# Patient Record
Sex: Male | Born: 1965 | Race: White | Hispanic: No | Marital: Single | Smoking: Former smoker
Health system: Southern US, Community
[De-identification: ages and names within clinical notes are randomized; demographics above are authoritative.]

## PROBLEM LIST (undated history)

## (undated) HISTORY — PX: HERNIA REPAIR: SHX51

---

## 2013-09-24 ENCOUNTER — Emergency Department: Payer: Self-pay | Admitting: Emergency Medicine

## 2015-06-01 IMAGING — CR CERVICAL SPINE - 2-3 VIEW
1 series · 4 of 4 positions shown · non-contrast
Comparison: None.

CLINICAL DATA: Neck pain for 6 weeks extending into the left
shoulder. Decreased range of motion.

EXAM:
CERVICAL SPINE - 2-3 VIEW

[Series 1: w cervical spine ap · 0.14mm/px · 4 of 4 slices shown]
[im 1/4]
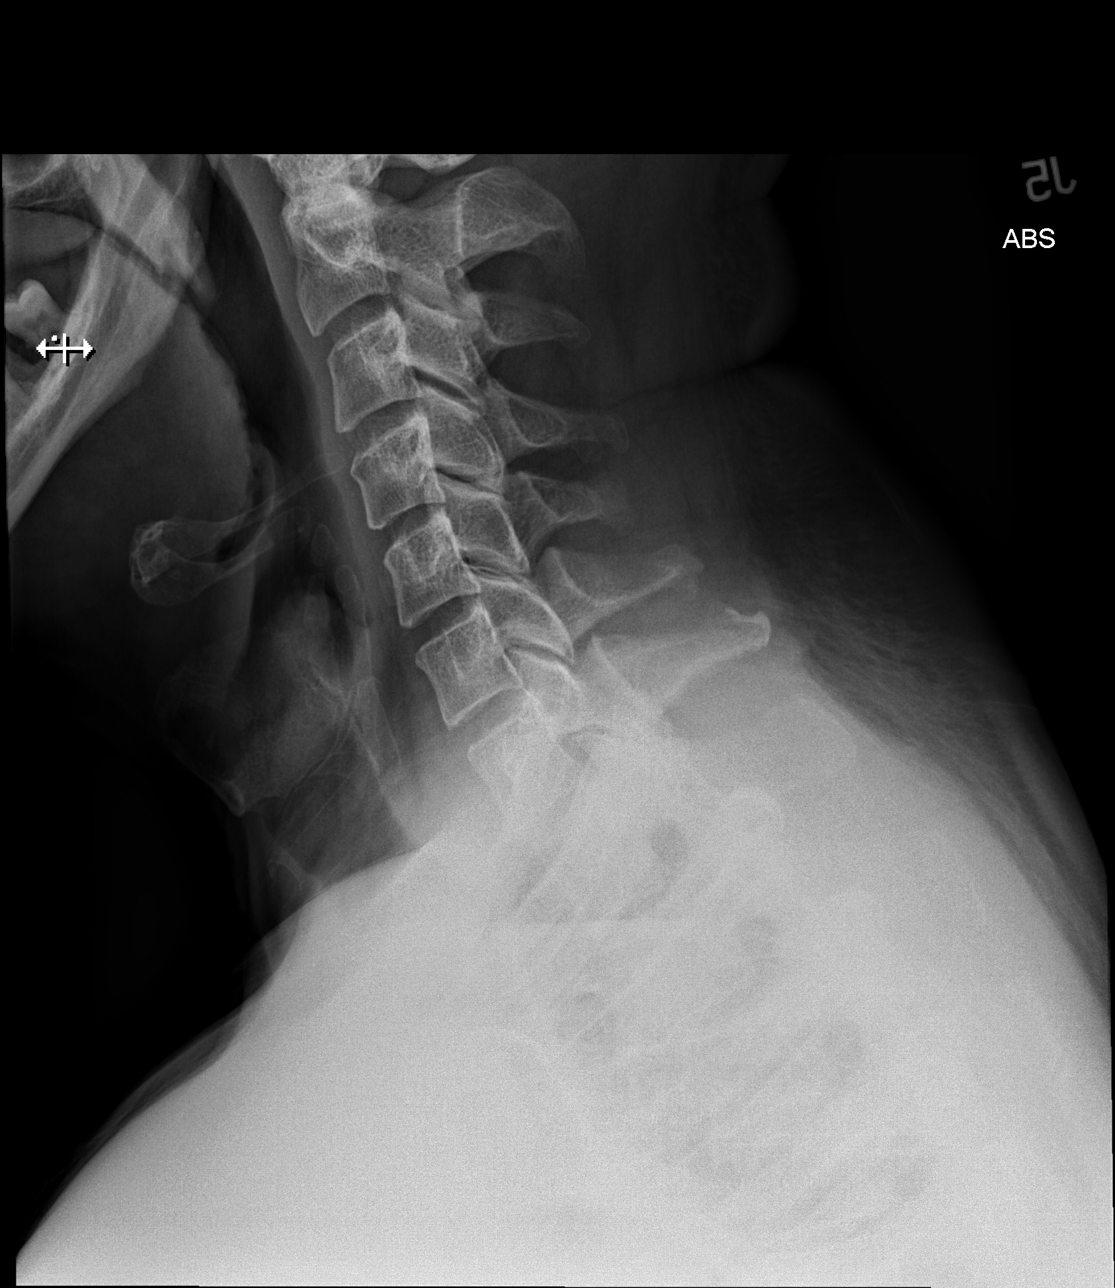
[im 2/4]
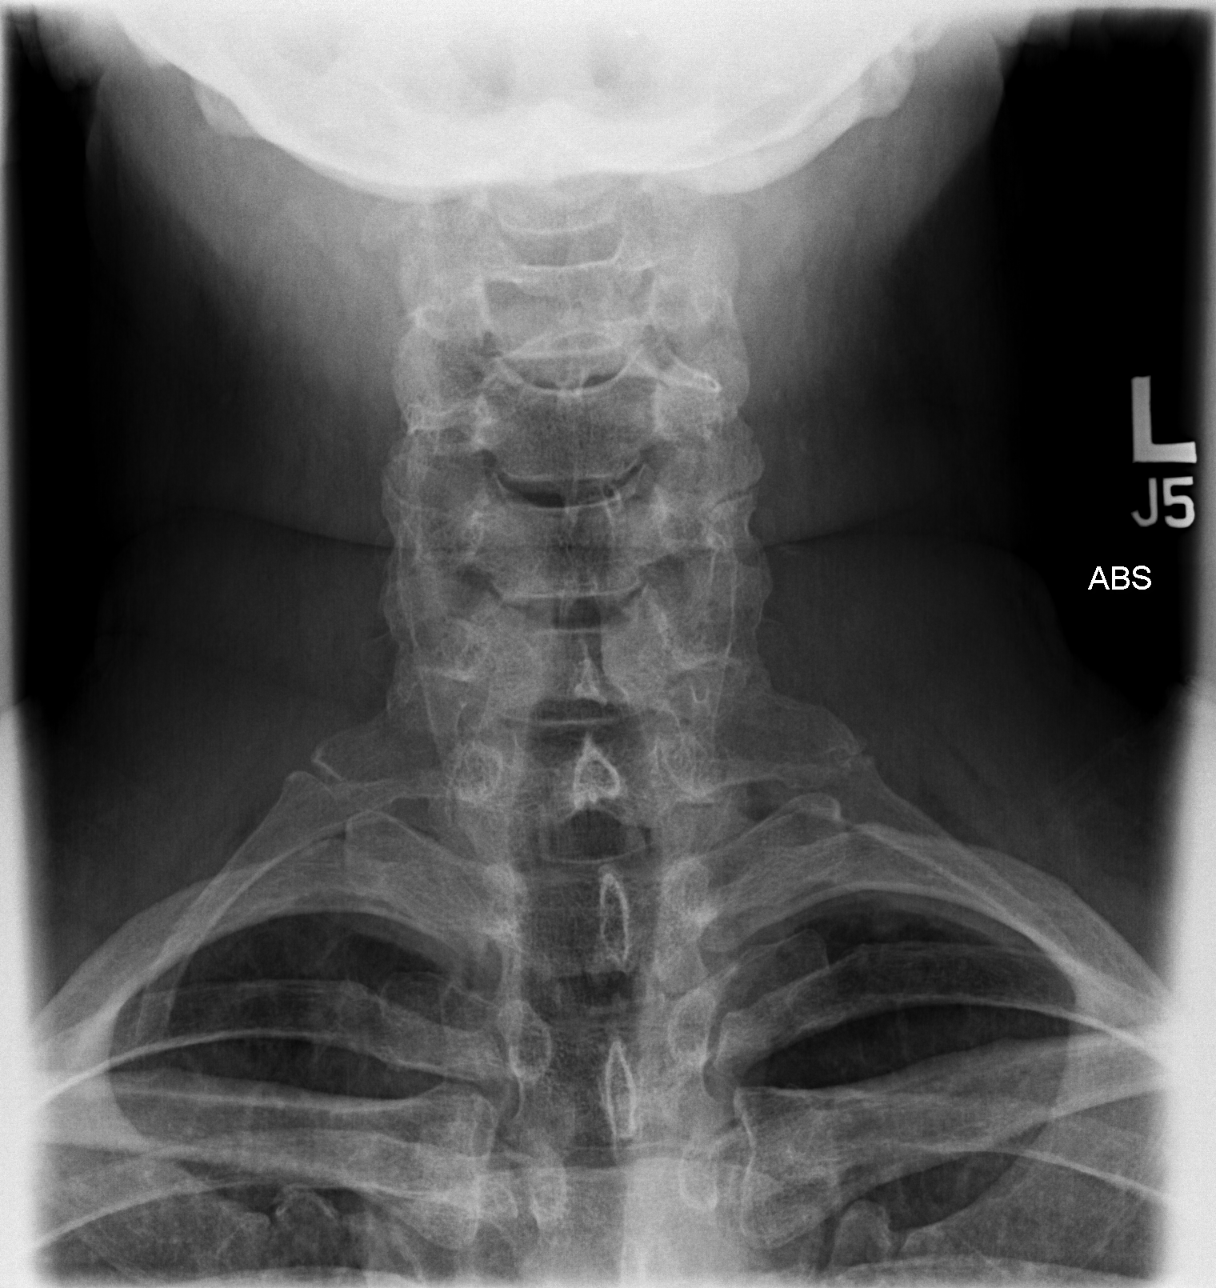
[im 3/4]
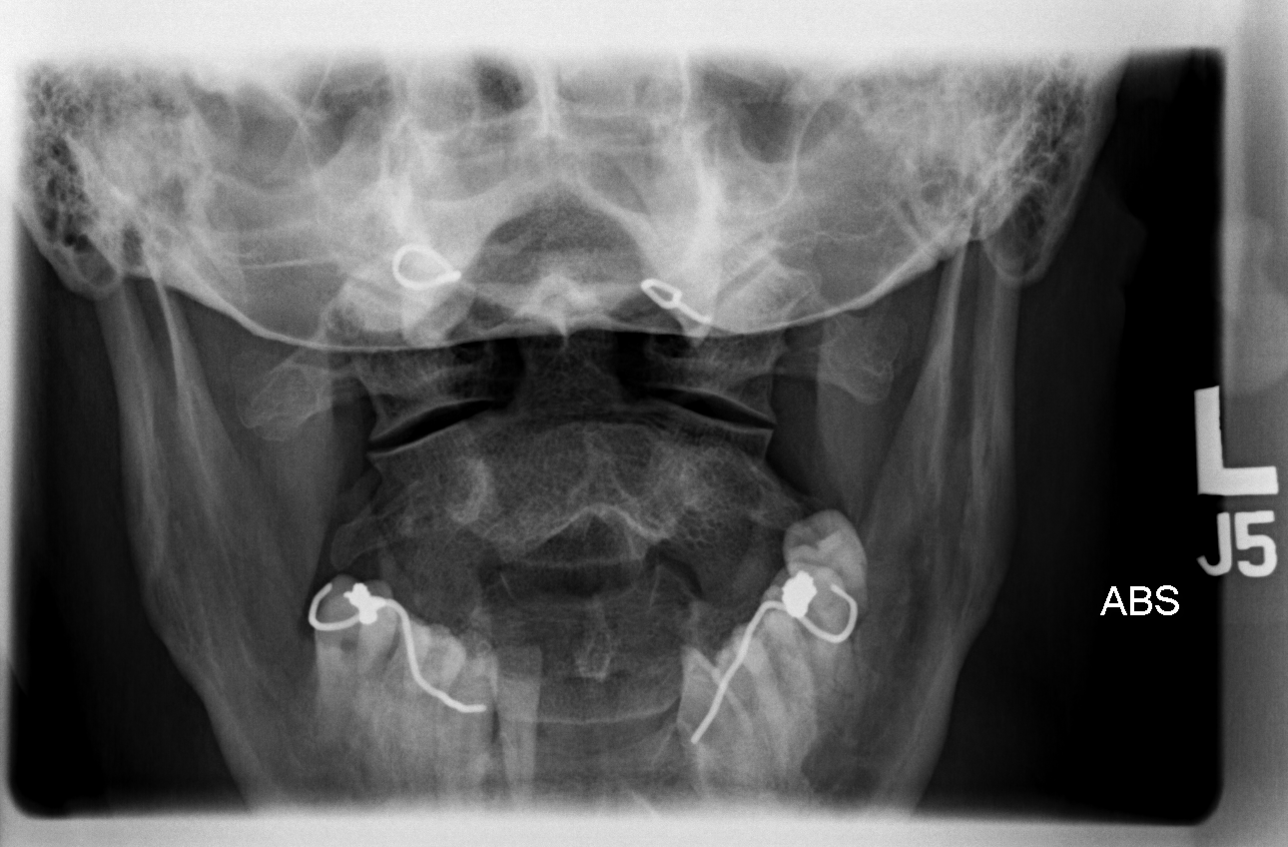
[im 4/4]
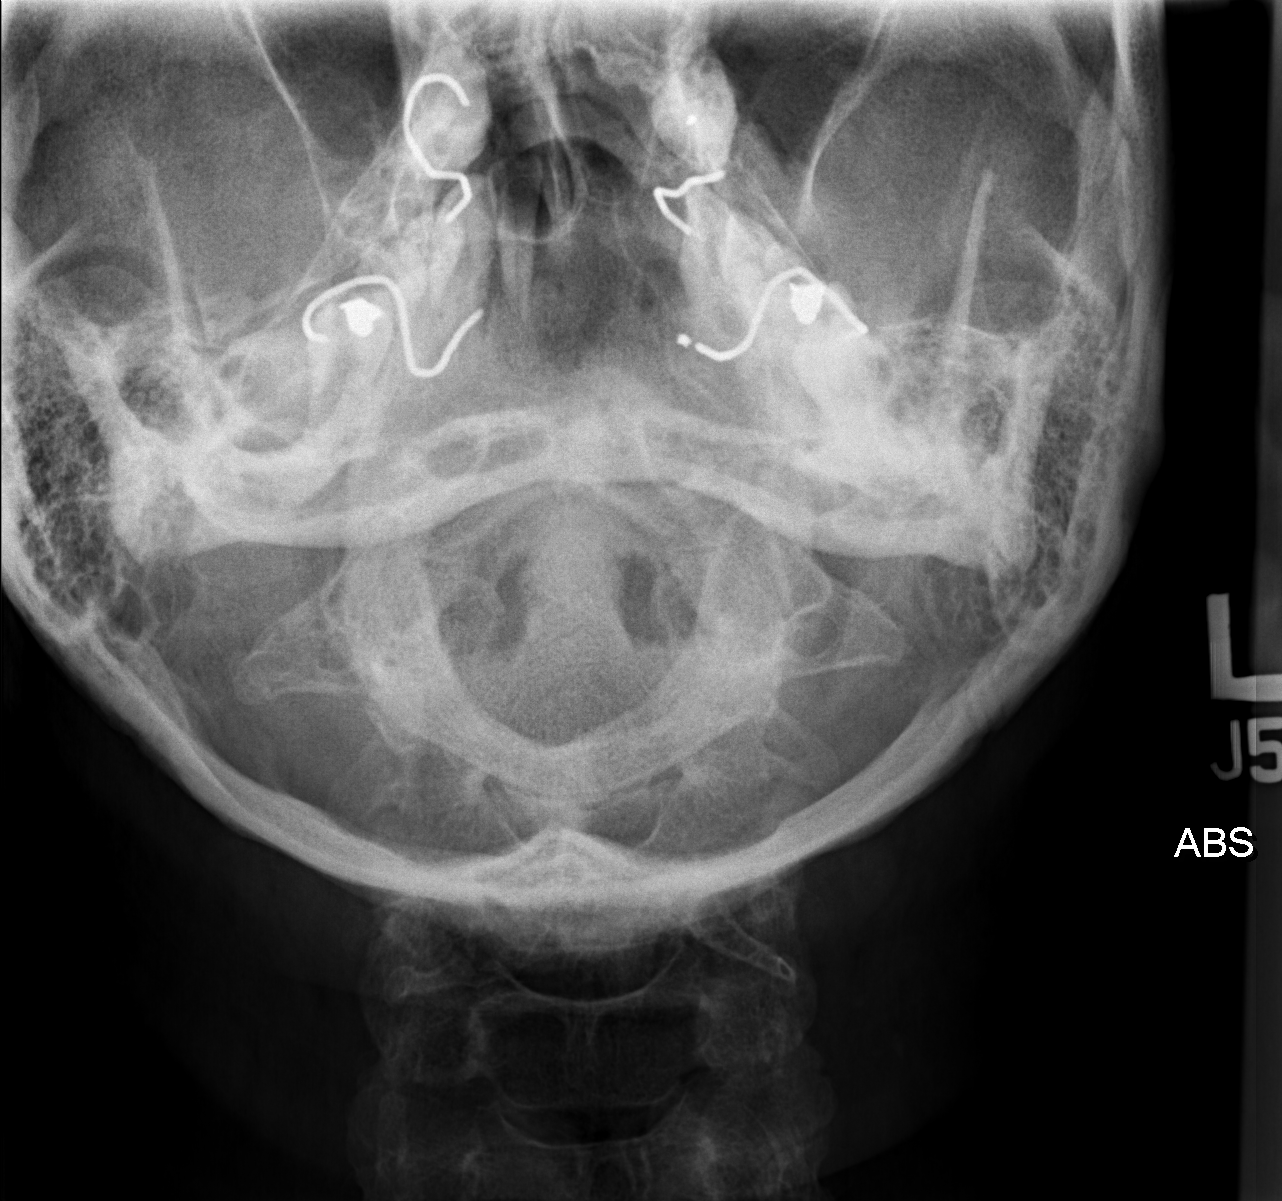

[4 of 4 positions shown; findings below may reference images not displayed]

FINDINGS: Vertebral alignment is normal. Prevertebral soft tissues are within
normal limits. Vertebral body heights are preserved. There is no
evidence of acute fracture. Intervertebral disc space heights are
preserved without significant degenerative changes identified.
IMPRESSION: No evidence of acute osseous abnormality or significant degenerative
change.

## 2016-05-08 ENCOUNTER — Emergency Department: Payer: Self-pay

## 2016-05-08 ENCOUNTER — Encounter: Payer: Self-pay | Admitting: Emergency Medicine

## 2016-05-08 ENCOUNTER — Emergency Department
Admission: EM | Admit: 2016-05-08 | Discharge: 2016-05-08 | Disposition: A | Discharge status: Home / Self Care | Payer: Self-pay | Attending: Emergency Medicine | Admitting: Emergency Medicine

## 2016-05-08 DIAGNOSIS — M62838 Other muscle spasm: Secondary | ICD-10-CM | POA: Insufficient documentation

## 2016-05-08 DIAGNOSIS — Z87891 Personal history of nicotine dependence: Secondary | ICD-10-CM | POA: Insufficient documentation

## 2016-05-08 DIAGNOSIS — M5412 Radiculopathy, cervical region: Secondary | ICD-10-CM | POA: Insufficient documentation

## 2016-05-08 MED ORDER — MELOXICAM 15 MG PO TABS: 15.0000 mg | ORAL_TABLET | Freq: Every day | ORAL | 0 refills | Status: AC

## 2016-05-08 MED ORDER — METHOCARBAMOL 500 MG PO TABS: 500.0000 mg | ORAL_TABLET | Freq: Four times a day (QID) | ORAL | 0 refills | Status: AC

## 2016-05-08 MED ORDER — KETOROLAC TROMETHAMINE 30 MG/ML IJ SOLN
30.0000 mg | Freq: Once | INTRAMUSCULAR | Status: AC
  Administered 2016-05-08: 30 mg via INTRAMUSCULAR
  Filled 2016-05-08: qty 1

## 2016-05-08 MED ORDER — ORPHENADRINE CITRATE 30 MG/ML IJ SOLN
60.0000 mg | Freq: Once | INTRAMUSCULAR | Status: AC
  Administered 2016-05-08: 60 mg via INTRAMUSCULAR
  Filled 2016-05-08: qty 2

## 2016-05-08 NOTE — ED Provider Notes (Signed)
[facility] Emergency Department Provider Note  ____________________________________________  Time seen: Approximately 6:26 PM  I have reviewed the triage vital signs and the nursing notes.   HISTORY  Chief Complaint Neck Pain and Back Pain    HPI Jeffrey Kerr is a 50 y.o. male who presents emergency department complaining of left-sided neck pain with radicular symptoms down left arm. Symptoms have been ongoing 3 weeks. Patient states that he was sitting when he looked over to the left and felt a pop in his neck. Patient began to have some left-sided neck pain with mild radicular symptoms after this event. Patient reports that 2-3 days ago he was helping move some furniture when the pain has become worsened. Abdomen or extremities the neck. No previous history of same. Patient does endorse some numbness and tingling in the digits of the left hand.No medications prior to arrival. No other complaints. No headaches, visual changes, chest pain, shortness of breath, abdominal pain, nausea or vomiting.   History reviewed. No pertinent past medical history.  There are no active problems to display for this patient.   Past Surgical History:  Procedure Laterality Date  . HERNIA REPAIR      Prior to Admission medications   Medication Sig Start Date End Date Taking? Authorizing Provider  meloxicam (MOBIC) 15 MG tablet Take 1 tablet (15 mg total) by mouth daily. 05/08/16   Delorise RoyalsJonathan D Cuthriell, PA-C  methocarbamol (ROBAXIN) 500 MG tablet Take 1 tablet (500 mg total) by mouth 4 (four) times daily. 05/08/16   Delorise RoyalsJonathan D Cuthriell, PA-C    Allergies Review of patient's allergies indicates no known allergies.  History reviewed. No pertinent family history.  Social History Social History  Substance Use Topics  . Smoking status: Former Games developermoker  . Smokeless tobacco: Never Used  . Alcohol use 0.6 oz/week    1 Cans of beer per week     Review of Systems   Constitutional: No fever/chills Eyes: No visual changes.  Cardiovascular: no chest pain. Respiratory: no cough. No SOB. Gastrointestinal: No abdominal pain.  No nausea, no vomiting.   Musculoskeletal: Positive for left-sided neck pain with radicular symptoms. Skin: Negative for rash, abrasions, lacerations, ecchymosis. Neurological: Negative for headaches, focal weakness or numbness. 10-point ROS otherwise negative.  ____________________________________________   PHYSICAL EXAM:  VITAL SIGNS: ED Triage Vitals  Enc Vitals Group     BP 05/08/16 1608 (!) 165/97     Pulse Rate 05/08/16 1608 75     Resp 05/08/16 1608 18     Temp 05/08/16 1608 97.9 F (36.6 C)     Temp Source 05/08/16 1608 Oral     SpO2 05/08/16 1608 (!) 75 %     Weight 05/08/16 1609 167 lb (75.8 kg)     Height 05/08/16 1609 5\' 7"  (1.702 m)     Head Circumference --      Peak Flow --      Pain Score 05/08/16 1609 9     Pain Loc --      Pain Edu? --      Excl. in GC? --      Constitutional: Alert and oriented. Well appearing and in no acute distress. Eyes: Conjunctivae are normal. PERRL. EOMI. Head: Atraumatic. Neck: No stridor.  No midline cervical spine tenderness to palpation. Patient is diffusely tender to palpation over the musculature left side of the neck. No specific point tenderness. No palpable abnormality. Neck is supple with full range of motion. Cap refill intact 5  digits both upper extremities. Sensation intact 5 digits both upper extremities.  Cardiovascular: Normal rate, regular rhythm. Normal S1 and S2.  Good peripheral circulation. Respiratory: Normal respiratory effort without tachypnea or retractions. Lungs CTAB. Good air entry to the bases with no decreased or absent breath sounds. Musculoskeletal: Full range of motion to all extremities. No gross deformities appreciated. Neurologic:  Normal speech and language. No gross focal neurologic deficits are appreciated.  Skin:  Skin is warm, dry  and intact. No rash noted. Psychiatric: Mood and affect are normal. Speech and behavior are normal. Patient exhibits appropriate insight and judgement.   ____________________________________________   LABS (all labs ordered are listed, but only abnormal results are displayed)  Labs Reviewed - No data to display ____________________________________________  EKG   ____________________________________________  RADIOLOGY Festus Barren Cuthriell, personally viewed and evaluated these images (plain radiographs) as part of my medical decision making, as well as reviewing the written report by the radiologist.  Dg Cervical Spine Complete  Result Date: 05/08/2016 CLINICAL DATA:  C/o left side of neck pain that started over 3 weeks ago. Pt states it is painful to move his neck to left without having pain shoot down his to his lower back. Pt states he was lifting heavy machinery with 2 other people 3-4 days ago which aggravated the neck pain. History of neck and back pain several years ago after incident at work. EXAM: CERVICAL SPINE - COMPLETE 4+ VIEW COMPARISON:  Plain film of the cervical spine dated 09/24/2013. FINDINGS: Alignment of the cervical spine is stable. No acute or suspicious osseous finding. No fracture line or displaced fracture fragment. No significant degenerative change seen. No more than mild neural foramen narrowing at any level. Calcifications noted in the left neck, presumably carotid artery atherosclerosis. IMPRESSION: No acute findings. No evidence of significant degenerative change. Carotid artery atherosclerosis. Electronically Signed   By: Bary Richard M.D.   On: 05/08/2016 19:07    ____________________________________________    PROCEDURES  Procedure(s) performed:    Procedures    Medications  ketorolac (TORADOL) 30 MG/ML injection 30 mg (not administered)  orphenadrine (NORFLEX) injection 60 mg (not administered)      ____________________________________________   INITIAL IMPRESSION / ASSESSMENT AND PLAN / ED COURSE  Pertinent labs & imaging results that were available during my care of the patient were reviewed by me and considered in my medical decision making (see chart for details).  Review of the [place] CSRS was performed in accordance of the [facility] prior to dispensing any controlled drugs.  Clinical Course    Patient's diagnosis is consistent with Cervical radiculopathy and muscle spasms of the neck. X-ray reveals no acute osseous abdomen benign. Exam is reassuring and no indication for further imaging. Patient is given Toradol muscle relaxer injection emergency department.. Patient will be discharged home with prescriptions for anti-inflammatory and muscle relaxer. Patient is to follow up with primary care as needed or otherwise directed. Patient is given ED precautions to return to the ED for any worsening or new symptoms.     ____________________________________________  FINAL CLINICAL IMPRESSION(S) / ED DIAGNOSES  Final diagnoses:  Cervical radiculopathy  Cervical paraspinal muscle spasm      NEW MEDICATIONS STARTED DURING THIS VISIT:  New Prescriptions   MELOXICAM (MOBIC) 15 MG TABLET    Take 1 tablet (15 mg total) by mouth daily.   METHOCARBAMOL (ROBAXIN) 500 MG TABLET    Take 1 tablet (500 mg total) by mouth 4 (four) times daily.  This chart was dictated using voice recognition software/Dragon. Despite best efforts to proofread, errors can occur which can change the meaning. Any change was purely unintentional.    Racheal PatchesJonathan D Cuthriell, PA-C 05/08/16 1927    Jene Everyobert Kinner, MD 05/08/16 2047

## 2016-05-08 NOTE — ED Triage Notes (Signed)
Pt arrived to ED via POV with a family member with reports of neck pain that started over 3 weeks ago. Pt states it is painful to move his neck to left without having pain shoot down his to his lower back. Pt states he was lifting heavy machinery with 2 other people 3-4 days ago which aggravated his neck pain. Hx of neck and back pain several years ago after incident at work.

## 2018-01-13 IMAGING — CR DG CERVICAL SPINE COMPLETE 4+V
1 series · 7 of 7 positions shown · non-contrast
Comparison: Plain film of the cervical spine dated 09/24/2013.

CLINICAL DATA: C/o left side of neck pain that started over 3 weeks
ago. Pt states it is painful to move his neck to left without having
pain shoot down his to his lower back. Pt states he was lifting
heavy machinery with 2 other people 3-4 days ago which aggravated
the neck pain. History of neck and back pain several years ago after
incident at work.

EXAM:
CERVICAL SPINE - COMPLETE 4+ VIEW

[Series 1: dg cervical spine complete · 0.14mm/px · 7 of 7 slices shown]
[im 1/7]
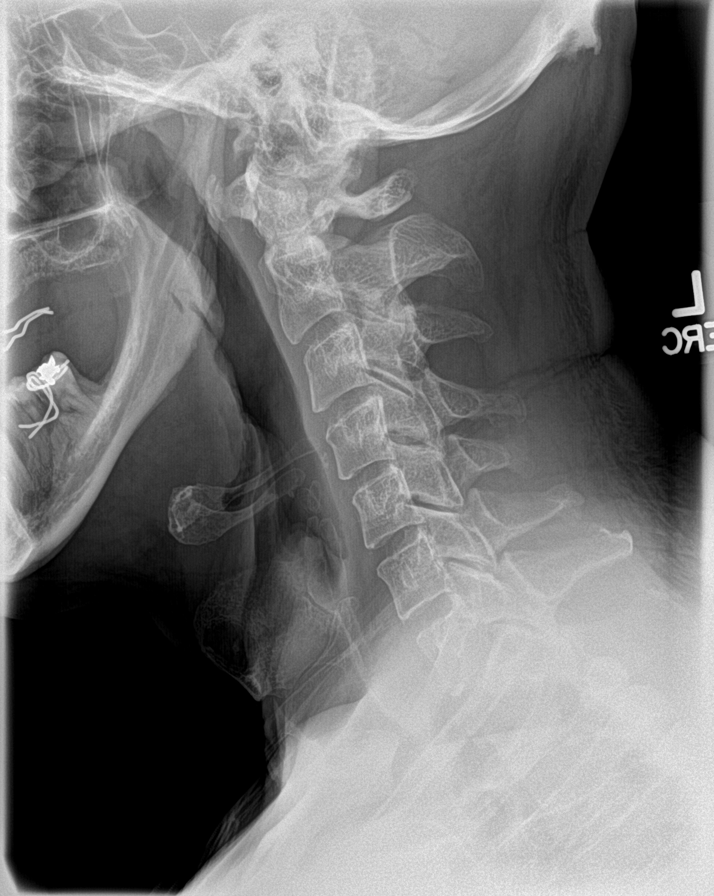
[im 2/7]
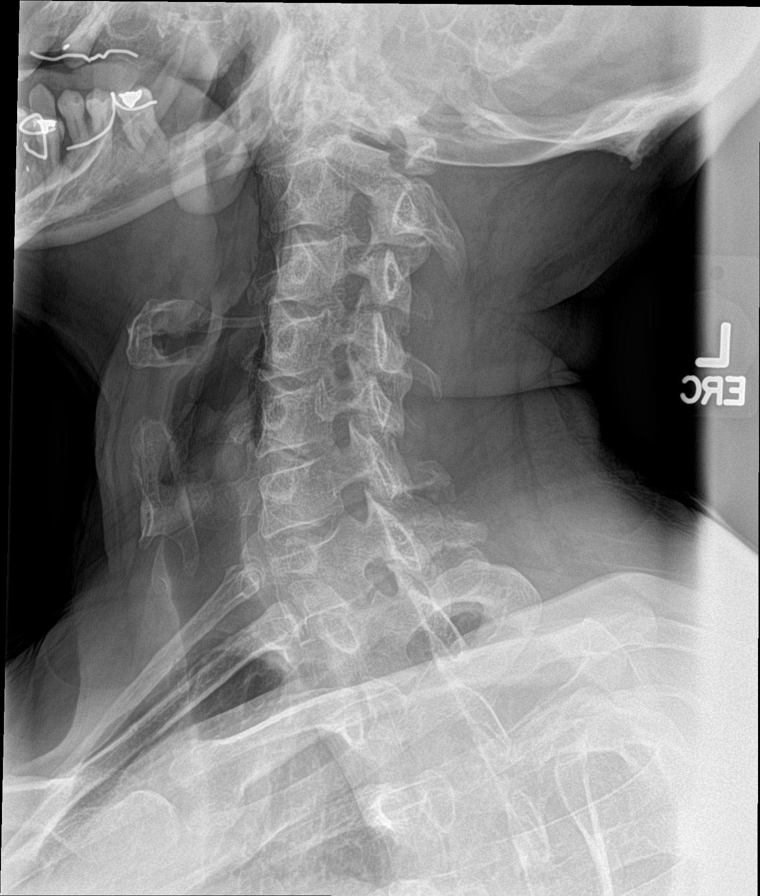
[im 3/7]
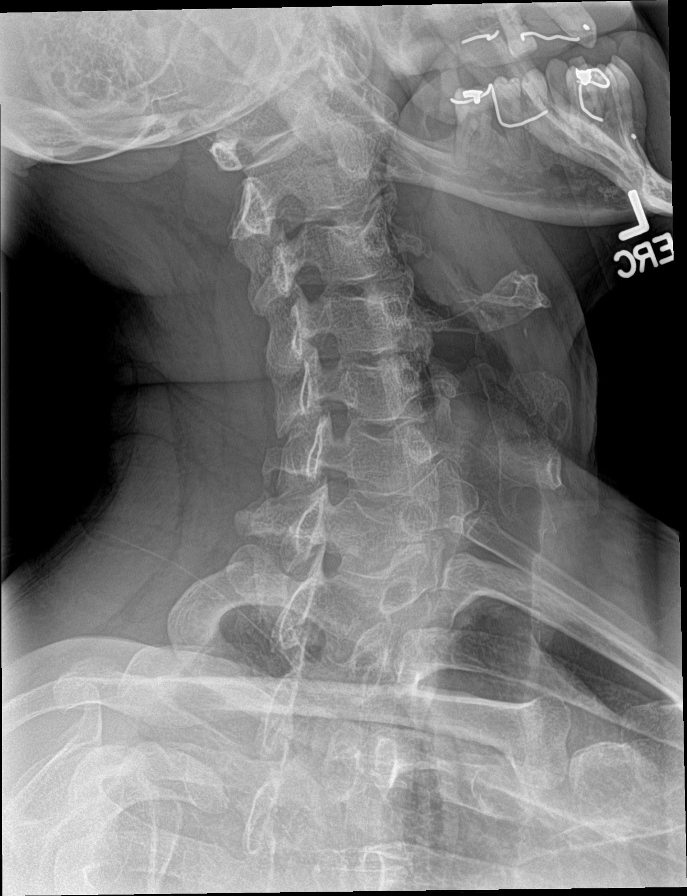
[im 4/7]
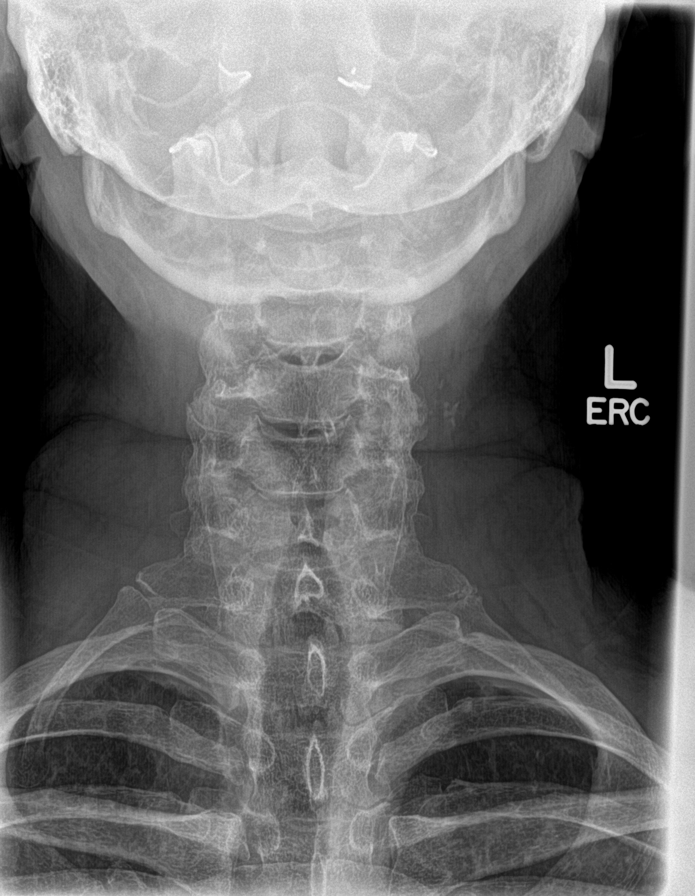
[im 5/7]
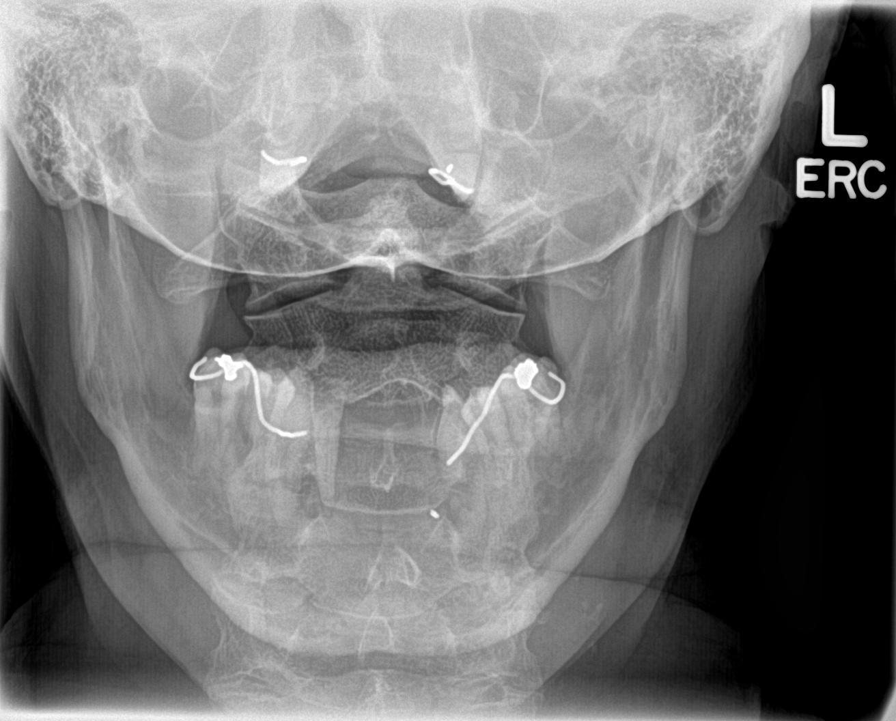
[im 6/7]
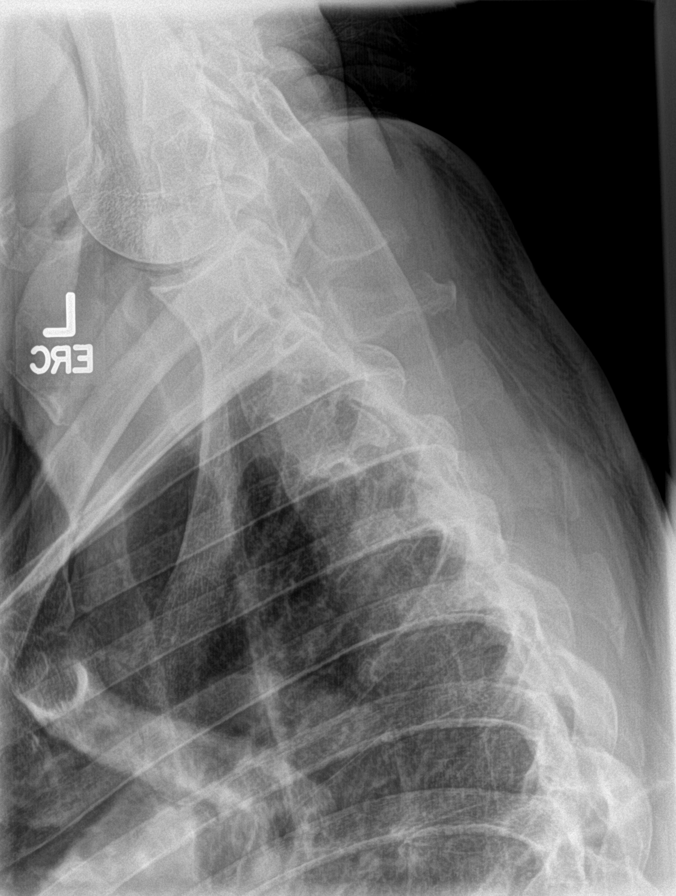
[im 7/7]
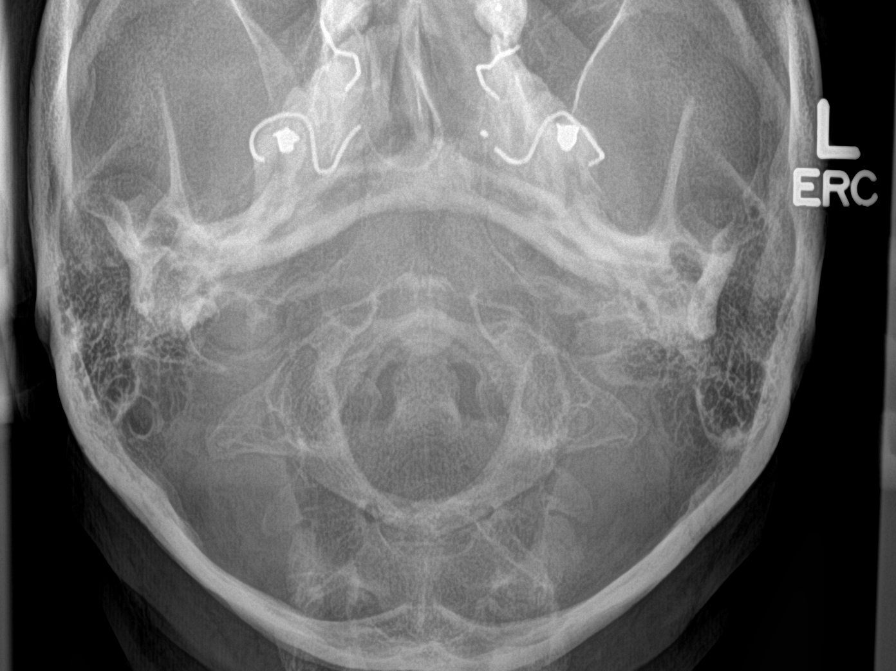

[7 of 7 positions shown; findings below may reference images not displayed]

FINDINGS: Alignment of the cervical spine is stable. No acute or suspicious
osseous finding. No fracture line or displaced fracture fragment. No
significant degenerative change seen. No more than mild neural
foramen narrowing at any level. Calcifications noted in the left
neck, presumably carotid artery atherosclerosis.
IMPRESSION: No acute findings. No evidence of significant degenerative change.
Carotid artery atherosclerosis.
